# Patient Record
Sex: Female | Born: 1935 | Race: White | Hispanic: No | Smoking: Never smoker
Health system: Southern US, Community
[De-identification: ages and names within clinical notes are randomized; demographics above are authoritative.]

## PROBLEM LIST (undated history)

## (undated) DIAGNOSIS — J984 Other disorders of lung: Secondary | ICD-10-CM

## (undated) DIAGNOSIS — W19XXXA Unspecified fall, initial encounter: Secondary | ICD-10-CM

## (undated) DIAGNOSIS — J449 Chronic obstructive pulmonary disease, unspecified: Secondary | ICD-10-CM

## (undated) DIAGNOSIS — I10 Essential (primary) hypertension: Secondary | ICD-10-CM

## (undated) DIAGNOSIS — E039 Hypothyroidism, unspecified: Secondary | ICD-10-CM

## (undated) DIAGNOSIS — R296 Repeated falls: Secondary | ICD-10-CM

---

## 2009-09-04 HISTORY — PX: SUBDURAL HEMORRHAGE DRAINAGE: SHX845

## 2010-06-12 ENCOUNTER — Ambulatory Visit: Payer: Self-pay | Admitting: Internal Medicine

## 2010-06-12 ENCOUNTER — Inpatient Hospital Stay (HOSPITAL_COMMUNITY): Admission: EM | Admit: 2010-06-12 | Discharge: 2010-06-15 | Payer: Self-pay | Admitting: Neurological Surgery

## 2010-06-25 ENCOUNTER — Inpatient Hospital Stay (HOSPITAL_COMMUNITY): Admission: EM | Admit: 2010-06-25 | Discharge: 2010-06-30 | Payer: Self-pay | Admitting: Emergency Medicine

## 2010-07-07 ENCOUNTER — Encounter: Admission: RE | Admit: 2010-07-07 | Discharge: 2010-07-07 | Payer: Self-pay | Admitting: Neurosurgery

## 2010-11-16 LAB — BASIC METABOLIC PANEL
BUN: 17 mg/dL (ref 6–23)
CO2: 27 mEq/L (ref 19–32)
Calcium: 8.6 mg/dL (ref 8.4–10.5)
Calcium: 8.6 mg/dL (ref 8.4–10.5)
Chloride: 108 mEq/L (ref 96–112)
Creatinine, Ser: 0.74 mg/dL (ref 0.4–1.2)
GFR calc Af Amer: >60 mL/min (ref >60)
GFR calc non Af Amer: >60 mL/min (ref >60)
GFR calc non Af Amer: >60 mL/min (ref >60)
Glucose, Bld: 140 mg/dL — ABNORMAL HIGH (ref 70–99)
Potassium: 4 mEq/L (ref 3.5–5.1)
Sodium: 135 mEq/L (ref 135–145)
Sodium: 138 mEq/L (ref 135–145)

## 2010-11-16 LAB — MRSA PCR SCREENING: MRSA by PCR: NEGATIVE

## 2010-11-16 LAB — COMPREHENSIVE METABOLIC PANEL
ALT: 17 U/L (ref 0–35)
AST: 23 U/L (ref 0–37)
Alkaline Phosphatase: 68 U/L (ref 39–117)
CO2: 27 mEq/L (ref 19–32)
Calcium: 8.5 mg/dL (ref 8.4–10.5)
GFR calc Af Amer: >60 mL/min (ref >60)
GFR calc non Af Amer: >60 mL/min (ref >60)
Potassium: 3.8 mEq/L (ref 3.5–5.1)
Sodium: 136 mEq/L (ref 135–145)
Total Protein: 6.2 g/dL (ref 6.0–8.3)

## 2010-11-16 LAB — URINE MICROSCOPIC-ADD ON

## 2010-11-16 LAB — DIFFERENTIAL
Eosinophils Absolute: 0.1 10*3/uL (ref 0.0–0.7)
Eosinophils Relative: 2 % (ref 0–5)
Lymphocytes Relative: 5 % — ABNORMAL LOW (ref 12–46)
Lymphs Abs: 0.7 10*3/uL (ref 0.7–4.0)
Lymphs Abs: 1.5 10*3/uL (ref 0.7–4.0)
Monocytes Absolute: 0.4 10*3/uL (ref 0.1–1.0)
Monocytes Relative: 3 % (ref 3–12)
Monocytes Relative: 8 % (ref 3–12)
Neutro Abs: 12.1 10*3/uL — ABNORMAL HIGH (ref 1.7–7.7)
Neutrophils Relative %: 92 % — ABNORMAL HIGH (ref 43–77)

## 2010-11-16 LAB — URINE CULTURE
Colony Count: NO GROWTH
Culture: NO GROWTH

## 2010-11-16 LAB — CBC
HCT: 31.8 % — ABNORMAL LOW (ref 36.0–46.0)
Hemoglobin: 10.9 g/dL — ABNORMAL LOW (ref 12.0–15.0)
Hemoglobin: 11.2 g/dL — ABNORMAL LOW (ref 12.0–15.0)
Hemoglobin: 11.7 g/dL — ABNORMAL LOW (ref 12.0–15.0)
MCH: 32.1 pg (ref 26.0–34.0)
MCHC: 33.3 g/dL (ref 30.0–36.0)
MCHC: 34.3 g/dL (ref 30.0–36.0)
RBC: 3.46 MIL/uL — ABNORMAL LOW (ref 3.87–5.11)
WBC: 6.2 10*3/uL (ref 4.0–10.5)

## 2010-11-16 LAB — URINALYSIS, ROUTINE W REFLEX MICROSCOPIC
Bilirubin Urine: NEGATIVE
Glucose, UA: NEGATIVE mg/dL
Ketones, ur: NEGATIVE mg/dL
Protein, ur: NEGATIVE mg/dL
Specific Gravity, Urine: 1.025 (ref 1.005–1.030)
Urobilinogen, UA: 1 mg/dL (ref 0.0–1.0)

## 2010-11-16 LAB — CULTURE, BLOOD (ROUTINE X 2)
Culture  Setup Time: 201110251430
Culture: NO GROWTH

## 2010-11-16 LAB — GLUCOSE, CAPILLARY: Glucose-Capillary: 99 mg/dL (ref 70–99)

## 2010-11-17 LAB — BASIC METABOLIC PANEL
CO2: 27 mEq/L (ref 19–32)
Calcium: 9.3 mg/dL (ref 8.4–10.5)
Chloride: 108 mEq/L (ref 96–112)
GFR calc Af Amer: >60 mL/min (ref >60)
Potassium: 4.6 mEq/L (ref 3.5–5.1)
Sodium: 139 mEq/L (ref 135–145)

## 2010-11-17 LAB — CBC
MCV: 94.7 fL (ref 78.0–100.0)
Platelets: 245 10*3/uL (ref 150–400)
RDW: 12.5 % (ref 11.5–15.5)
WBC: 5.9 10*3/uL (ref 4.0–10.5)

## 2010-11-17 LAB — CARDIAC PANEL(CRET KIN+CKTOT+MB+TROPI)
CK, MB: 1 ng/mL (ref 0.3–4.0)
CK, MB: 1.7 ng/mL (ref 0.3–4.0)
Relative Index: INVALID (ref 0.0–2.5)
Relative Index: INVALID (ref 0.0–2.5)
Relative Index: INVALID (ref 0.0–2.5)
Total CK: 71 U/L (ref 7–177)
Troponin I: 0.01 ng/mL (ref 0.00–0.06)
Troponin I: 0.02 ng/mL (ref 0.00–0.06)

## 2010-11-17 LAB — HEPATIC FUNCTION PANEL
ALT: 18 U/L (ref 0–35)
Total Protein: 6.7 g/dL (ref 6.0–8.3)

## 2010-11-17 LAB — DIFFERENTIAL
Basophils Absolute: 0 10*3/uL (ref 0.0–0.1)
Eosinophils Absolute: 0.1 10*3/uL (ref 0.0–0.7)
Eosinophils Relative: 2 % (ref 0–5)
Lymphocytes Relative: 18 % (ref 12–46)
Neutrophils Relative %: 70 % (ref 43–77)

## 2010-11-17 LAB — PHOSPHORUS: Phosphorus: 2.5 mg/dL (ref 2.3–4.6)

## 2013-02-15 DIAGNOSIS — M6281 Muscle weakness (generalized): Secondary | ICD-10-CM

## 2014-02-27 ENCOUNTER — Inpatient Hospital Stay (HOSPITAL_COMMUNITY): Admit: 2014-02-27 | Payer: Self-pay | Source: Other Acute Inpatient Hospital | Admitting: Internal Medicine

## 2014-02-28 ENCOUNTER — Inpatient Hospital Stay (HOSPITAL_COMMUNITY): Payer: Medicare Other

## 2014-02-28 ENCOUNTER — Encounter (HOSPITAL_COMMUNITY): Payer: Self-pay | Admitting: Internal Medicine

## 2014-02-28 ENCOUNTER — Inpatient Hospital Stay (HOSPITAL_COMMUNITY)
Admission: AD | Admit: 2014-02-28 | Discharge: 2014-03-03 | DRG: 551 | Disposition: A | Discharge status: Skilled Nursing Facility | Payer: Medicare Other | Source: Other Acute Inpatient Hospital | Attending: Internal Medicine | Admitting: Internal Medicine

## 2014-02-28 DIAGNOSIS — J449 Chronic obstructive pulmonary disease, unspecified: Secondary | ICD-10-CM | POA: Diagnosis present

## 2014-02-28 DIAGNOSIS — S22009A Unspecified fracture of unspecified thoracic vertebra, initial encounter for closed fracture: Principal | ICD-10-CM | POA: Diagnosis present

## 2014-02-28 DIAGNOSIS — J42 Unspecified chronic bronchitis: Secondary | ICD-10-CM

## 2014-02-28 DIAGNOSIS — Z8673 Personal history of transient ischemic attack (TIA), and cerebral infarction without residual deficits: Secondary | ICD-10-CM

## 2014-02-28 DIAGNOSIS — G9519 Other vascular myelopathies: Secondary | ICD-10-CM | POA: Diagnosis present

## 2014-02-28 DIAGNOSIS — M549 Dorsalgia, unspecified: Secondary | ICD-10-CM | POA: Diagnosis present

## 2014-02-28 DIAGNOSIS — J4489 Other specified chronic obstructive pulmonary disease: Secondary | ICD-10-CM | POA: Diagnosis present

## 2014-02-28 DIAGNOSIS — W19XXXA Unspecified fall, initial encounter: Secondary | ICD-10-CM | POA: Diagnosis present

## 2014-02-28 DIAGNOSIS — IMO0001 Reserved for inherently not codable concepts without codable children: Secondary | ICD-10-CM

## 2014-02-28 DIAGNOSIS — Z833 Family history of diabetes mellitus: Secondary | ICD-10-CM

## 2014-02-28 DIAGNOSIS — Z7982 Long term (current) use of aspirin: Secondary | ICD-10-CM

## 2014-02-28 DIAGNOSIS — I10 Essential (primary) hypertension: Secondary | ICD-10-CM | POA: Diagnosis present

## 2014-02-28 DIAGNOSIS — Z8249 Family history of ischemic heart disease and other diseases of the circulatory system: Secondary | ICD-10-CM

## 2014-02-28 DIAGNOSIS — J984 Other disorders of lung: Secondary | ICD-10-CM | POA: Diagnosis present

## 2014-02-28 DIAGNOSIS — M546 Pain in thoracic spine: Secondary | ICD-10-CM

## 2014-02-28 DIAGNOSIS — E034 Atrophy of thyroid (acquired): Secondary | ICD-10-CM

## 2014-02-28 DIAGNOSIS — Z9181 History of falling: Secondary | ICD-10-CM

## 2014-02-28 DIAGNOSIS — E039 Hypothyroidism, unspecified: Secondary | ICD-10-CM | POA: Diagnosis present

## 2014-02-28 DIAGNOSIS — M4850XA Collapsed vertebra, not elsewhere classified, site unspecified, initial encounter for fracture: Secondary | ICD-10-CM

## 2014-02-28 DIAGNOSIS — IMO0002 Reserved for concepts with insufficient information to code with codable children: Secondary | ICD-10-CM

## 2014-02-28 DIAGNOSIS — R296 Repeated falls: Secondary | ICD-10-CM

## 2014-02-28 HISTORY — DX: Other disorders of lung: J98.4

## 2014-02-28 HISTORY — DX: Unspecified fall, initial encounter: W19.XXXA

## 2014-02-28 HISTORY — DX: Hypothyroidism, unspecified: E03.9

## 2014-02-28 HISTORY — DX: Repeated falls: R29.6

## 2014-02-28 HISTORY — DX: Chronic obstructive pulmonary disease, unspecified: J44.9

## 2014-02-28 HISTORY — DX: Essential (primary) hypertension: I10

## 2014-02-28 LAB — COMPREHENSIVE METABOLIC PANEL
ALBUMIN: 3.1 g/dL — AB (ref 3.5–5.2)
ALT: 23 U/L (ref 0–35)
AST: 33 U/L (ref 0–37)
Alkaline Phosphatase: 113 U/L (ref 39–117)
BILIRUBIN TOTAL: 0.5 mg/dL (ref 0.3–1.2)
BUN: 19 mg/dL (ref 6–23)
CALCIUM: 9.1 mg/dL (ref 8.4–10.5)
CHLORIDE: 101 meq/L (ref 96–112)
CO2: 23 mEq/L (ref 19–32)
CREATININE: 0.79 mg/dL (ref 0.50–1.10)
GFR calc Af Amer: 82 mL/min — ABNORMAL LOW (ref >90)
GFR calc non Af Amer: 71 mL/min — ABNORMAL LOW (ref >90)
Glucose, Bld: 86 mg/dL (ref 70–99)
Potassium: 4.1 mEq/L (ref 3.7–5.3)
Sodium: 139 mEq/L (ref 137–147)
TOTAL PROTEIN: 7.1 g/dL (ref 6.0–8.3)

## 2014-02-28 LAB — PROTIME-INR
INR: 1.02 (ref 0.00–1.49)
Prothrombin Time: 13.4 seconds (ref 11.6–15.2)

## 2014-02-28 LAB — CBC WITH DIFFERENTIAL/PLATELET
Basophils Absolute: 0 10*3/uL (ref 0.0–0.1)
Basophils Relative: 0 % (ref 0–1)
EOS ABS: 0.1 10*3/uL (ref 0.0–0.7)
EOS PCT: 2 % (ref 0–5)
HCT: 33.4 % — ABNORMAL LOW (ref 36.0–46.0)
HEMOGLOBIN: 11.2 g/dL — AB (ref 12.0–15.0)
LYMPHS ABS: 1.7 10*3/uL (ref 0.7–4.0)
Lymphocytes Relative: 23 % (ref 12–46)
MCH: 32.6 pg (ref 26.0–34.0)
MCHC: 33.5 g/dL (ref 30.0–36.0)
MCV: 97.1 fL (ref 78.0–100.0)
MONOS PCT: 7 % (ref 3–12)
Monocytes Absolute: 0.5 10*3/uL (ref 0.1–1.0)
Neutro Abs: 5 10*3/uL (ref 1.7–7.7)
Neutrophils Relative %: 68 % (ref 43–77)
Platelets: 306 10*3/uL (ref 150–400)
RBC: 3.44 MIL/uL — AB (ref 3.87–5.11)
RDW: 13.1 % (ref 11.5–15.5)
WBC: 7.5 10*3/uL (ref 4.0–10.5)

## 2014-02-28 LAB — APTT: aPTT: 32 seconds (ref 24–37)

## 2014-02-28 MED ORDER — POLYETHYLENE GLYCOL 3350 17 G PO PACK
17.0000 g | PACK | Freq: Every day | ORAL | Status: DC
  Administered 2014-02-28 – 2014-03-01 (×2): 17 g via ORAL
  Filled 2014-02-28 (×2): qty 1

## 2014-02-28 MED ORDER — SENNA 8.6 MG PO TABS
1.0000 | ORAL_TABLET | Freq: Two times a day (BID) | ORAL | Status: DC
  Administered 2014-02-28 (×2): 8.6 mg via ORAL
  Filled 2014-02-28 (×2): qty 1

## 2014-02-28 MED ORDER — ENOXAPARIN SODIUM 40 MG/0.4ML ~~LOC~~ SOLN
40.0000 mg | Freq: Every day | SUBCUTANEOUS | Status: DC
  Administered 2014-02-28 – 2014-03-03 (×4): 40 mg via SUBCUTANEOUS
  Filled 2014-02-28 (×4): qty 0.4

## 2014-02-28 MED ORDER — CROMOLYN SODIUM 20 MG/2ML IN NEBU
20.0000 mg | INHALATION_SOLUTION | Freq: Two times a day (BID) | RESPIRATORY_TRACT | Status: DC
  Administered 2014-02-28 – 2014-03-02 (×5): 20 mg via RESPIRATORY_TRACT
  Filled 2014-02-28 (×16): qty 2

## 2014-02-28 MED ORDER — OXYCODONE HCL ER 10 MG PO T12A
20.0000 mg | EXTENDED_RELEASE_TABLET | Freq: Two times a day (BID) | ORAL | Status: DC
  Administered 2014-02-28 (×2): 20 mg via ORAL
  Filled 2014-02-28 (×2): qty 2

## 2014-02-28 MED ORDER — ONDANSETRON HCL 4 MG PO TABS: 4.0000 mg | ORAL_TABLET | Freq: Four times a day (QID) | ORAL | Status: DC | PRN

## 2014-02-28 MED ORDER — OXYCODONE HCL 5 MG PO TABS
5.0000 mg | ORAL_TABLET | ORAL | Status: DC | PRN
  Administered 2014-02-28 (×3): 5 mg via ORAL
  Filled 2014-02-28 (×3): qty 1

## 2014-02-28 MED ORDER — MORPHINE SULFATE 2 MG/ML IJ SOLN
1.0000 mg | INTRAMUSCULAR | Status: DC | PRN
  Administered 2014-02-28: 2 mg via INTRAVENOUS
  Administered 2014-02-28: 1 mg via INTRAVENOUS
  Filled 2014-02-28 (×2): qty 1

## 2014-02-28 MED ORDER — HYDRALAZINE HCL 20 MG/ML IJ SOLN
10.0000 mg | INTRAMUSCULAR | Status: DC | PRN
  Filled 2014-02-28: qty 1

## 2014-02-28 MED ORDER — ACETAMINOPHEN 325 MG PO TABS: 650.0000 mg | ORAL_TABLET | Freq: Four times a day (QID) | ORAL | Status: DC | PRN

## 2014-02-28 MED ORDER — ALBUTEROL SULFATE (2.5 MG/3ML) 0.083% IN NEBU: 2.5000 mg | INHALATION_SOLUTION | RESPIRATORY_TRACT | Status: DC | PRN

## 2014-02-28 MED ORDER — SODIUM CHLORIDE 0.9 % IJ SOLN
3.0000 mL | Freq: Two times a day (BID) | INTRAMUSCULAR | Status: DC
  Administered 2014-02-28 – 2014-03-03 (×5): 3 mL via INTRAVENOUS

## 2014-02-28 MED ORDER — LISINOPRIL 20 MG PO TABS
20.0000 mg | ORAL_TABLET | Freq: Every day | ORAL | Status: DC
  Administered 2014-02-28: 20 mg via ORAL
  Filled 2014-02-28: qty 1

## 2014-02-28 MED ORDER — FENTANYL 25 MCG/HR TD PT72: 25.0000 ug | MEDICATED_PATCH | TRANSDERMAL | Status: DC

## 2014-02-28 MED ORDER — ONDANSETRON HCL 4 MG/2ML IJ SOLN: 4.0000 mg | Freq: Four times a day (QID) | INTRAMUSCULAR | Status: DC | PRN

## 2014-02-28 MED ORDER — HYDROCODONE-ACETAMINOPHEN 5-325 MG PO TABS: 1.0000 | ORAL_TABLET | Freq: Four times a day (QID) | ORAL | Status: DC | PRN

## 2014-02-28 MED ORDER — ACETAMINOPHEN 650 MG RE SUPP: 650.0000 mg | Freq: Four times a day (QID) | RECTAL | Status: DC | PRN

## 2014-02-28 MED ORDER — LEVOTHYROXINE SODIUM 25 MCG PO TABS
50.0000 ug | ORAL_TABLET | Freq: Every day | ORAL | Status: DC
  Administered 2014-02-28 – 2014-03-03 (×4): 50 ug via ORAL
  Filled 2014-02-28 (×4): qty 2

## 2014-02-28 NOTE — Consult Note (Signed)
Reason for Consult: T12 compression fracture, thoracic pain Referring Physician: Dr. Frederich ChaPatel  Nicole Stein is an 80 y.o. female.  HPI: The patient is a 80 year old white female who by report fell approximately 2 weeks ago. She was seen at the [facility] and diagnosed with a T12 compression fracture. At that time the patient was treated and released. She returned to the hospital yesterday complaining of continued/increasing back pain. A thoracic MRI was obtained which demonstrated a T12 compression fracture with spinal stenosis. The ER doctor said that the spine surgeon in [place] was out of town so they request a transfer to [facility]. The patient was admitted by Dr. Allena KatzPatel.  Presently the patient is alert and pleasant. She complains of pain at approximately T12. She has some pain radiating to her right leg as far as her knee. She has no numbness, tingling, or weakness. She has not been treated in an orthosis.  Past Medical History  Diagnosis Date  . Hypothyroidism   . Chronic lung disease   . COPD (chronic obstructive pulmonary disease)   . Falls   . Hypertension     Past Surgical History  Procedure Laterality Date  . Subdural hemorrhage drainage  2011    Family History  Problem Relation Age of Onset  . CAD    . Hypertension    . Diabetes      Social History:  reports that she has never smoked. She does not have any smokeless tobacco history on file. She reports that she does not drink alcohol. Her drug history is not on file.  Allergies: No Known Allergies  Medications:  I have reviewed the patient's current medications. Prior to Admission:  Prescriptions prior to admission  Medication Sig Dispense Refill  . albuterol (PROVENTIL) (2.5 MG/3ML) 0.083% nebulizer solution Take 2.5 mg by nebulization every 4 (four) hours as needed for wheezing or shortness of breath.      [name]. aspirin 81 MG chewable tablet Chew 81 mg by mouth daily.      . cromolyn  (INTAL) 20 MG/2ML nebulizer solution Take 20 mg by nebulization 2 (two) times daily.      [name]. HYDROcodone-acetaminophen (NORCO/VICODIN) 5-325 MG per tablet Take 1 tablet by mouth every 6 (six) hours as needed for moderate pain.      [name]. levothyroxine (SYNTHROID, LEVOTHROID) 50 MCG tablet Take 50 mcg by mouth daily before breakfast.      . lisinopril (PRINIVIL,ZESTRIL) 20 MG tablet Take 20 mg by mouth daily.       Scheduled: . cromolyn  20 mg Nebulization BID  . levothyroxine  50 mcg Oral QAC breakfast  . lisinopril  20 mg Oral Daily  . senna  1 tablet Oral BID  . sodium chloride  3 mL Intravenous Q12H   Continuous:  ZOX:[id number]:acetaminophen, acetaminophen, albuterol, HYDROcodone-acetaminophen, morphine injection, ondansetron (ZOFRAN) IV, ondansetron, oxyCODONE Anti-infectives   None       No results found for this or any previous visit (from the past 48 hour(s)).  No results found.  ROS as above, the patient denies neck pain Blood pressure 216/77, pulse 81, temperature 97.9 F (36.6 C), temperature source Oral, resp. rate 16, height 5\' 4"  (1.626 m), weight 60.601 kg (133 lb 9.6 oz), SpO2 96.00%. Physical Exam  General: An alert and pleasant 80 year old white female in no apparent distress  HEENT: Normocephalic, extraocular muscles intact,  Neck: Supple without masses or deformities. She is an age-appropriate normal range of motion.  Thorax: Symmetric  Abdomen: Soft  Extremities: No obvious abnormalities  Neurologic exam: The patient is alert and oriented x3, Glasgow Coma Scale 15. Cranial nerves II through XII were examined bilaterally and grossly normal. Vision and hearing are grossly normal bilaterally. Sensory function is intact to light touch sensation all tested dermatomes bilaterally. Cerebellar function is normal to rapid alternating movements of the upper extremities bilaterally. Her strength is normal in her bilateral biceps, triceps, hand grip, quadriceps, gastrocnemius,  dorsiflexors.  I have reviewed the patient's thoracic [facility]. It demonstrates the patient is a T12 compression fracture with retropulsion of the fracture into the spinal canal. She has moderate spinal stenosis. There is no evidence of spinal cord signal change.  Assessment/Plan: T12 compression fracture, thoracic pain: I have discussed situation with the patient. We have discussed the various treatment options including conservative treatment with an orthosis versus surgery. At her age I think that surgery is best avoided. I will have her fitted for a TLSO. When she has the orthosis we can begin to mobilize her. I have answered all her questions.  JENKINS,JEFFREY D 02/28/2014, 6:51 AM

## 2014-02-28 NOTE — Progress Notes (Signed)
PATIENT DETAILS Name: Nicole Stein Age: 80 y.o. Sex: female Date of Birth: Jul 03, 1922 Admit Date: 02/28/2014 Admitting Physician Lynden OxfordPranav Patel, MD PCP:No PCP Per Patient  Subjective: Still complains of back pain.  Assessment/Plan: Principal Problem:   Vertebral compression fracture-T12 - Secondary to mechanical fall, unfortunately patient sustained a T12 compression fracture with retropulsion of fracture fragments into the spinal canal causing spinal stenosis. Seen by neurosurgery, options including surgery and conservative teams discussed with family, current recommendations are to avoid surgery if at all possible. Await TLSO brace, then mobilize with physical therapy. Start long-acting OxyContin 20 mg twice a day, continue using oxycodone for moderate breakthrough pain, IV morphine for severe breakthrough pain. Continue Senokot for bowel regimen. Follow clinical course.  Active Problems: COPD - Lungs clear, continue with as needed albuterol nebulizer.    Hypothyroidism - Continue levothyroxine  Hypertension - Controlled, continue with lisinopril   Disposition: Remain inpatient  DVT Prophylaxis: Prophylactic Lovenox  Code Status: Full code  Family Communication None at bedside  Procedures:  None  CONSULTS:  Neurosurgery  Time spent 40 minutes-which includes 50% of the time with face-to-face with patient/ family and coordinating care related to the above assessment and plan.    MEDICATIONS: Scheduled Meds: . cromolyn  20 mg Nebulization BID  . levothyroxine  50 mcg Oral QAC breakfast  . lisinopril  20 mg Oral Daily  . OxyCODONE  20 mg Oral Q12H  . senna  1 tablet Oral BID  . sodium chloride  3 mL Intravenous Q12H   Continuous Infusions:  PRN Meds:.acetaminophen, acetaminophen, albuterol, hydrALAZINE, morphine injection, ondansetron (ZOFRAN) IV, ondansetron, oxyCODONE  Antibiotics: Anti-infectives   None       PHYSICAL EXAM: Vital signs  in last 24 hours: Filed Vitals:   02/28/14 0030 02/28/14 0549 02/28/14 1033  BP: 216/77 110/84 170/81  Pulse: 81 77 75  Temp: 97.9 F (36.6 C) 98.3 F (36.8 C) 97.8 F (36.6 C)  TempSrc: Oral Oral Axillary  Resp: 16 18 18   Height: 5\' 4"  (1.626 m)    Weight: 60.601 kg (133 lb 9.6 oz)    SpO2: 96% 92% 95%    Weight change:  Filed Weights   02/28/14 0030  Weight: 60.601 kg (133 lb 9.6 oz)   Body mass index is 22.92 kg/(m^2).   Gen Exam: Awake and alert with clear speech.   Neck: Supple, No JVD.   Chest: B/L Clear.   CVS: S1 S2 Regular, no murmurs.  Abdomen: soft, BS +, non tender, non distended.  Extremities: no edema, lower extremities warm to touch. Neurologic: Non Focal.   Skin: No Rash.   Wounds: N/A.    Intake/Output from previous day: No intake or output data in the 24 hours ending 02/28/14 1308   LAB RESULTS: CBC  Recent Labs Lab 02/28/14 0724  WBC 7.5  HGB 11.2*  HCT 33.4*  PLT 306  MCV 97.1  MCH 32.6  MCHC 33.5  RDW 13.1  LYMPHSABS 1.7  MONOABS 0.5  EOSABS 0.1  BASOSABS 0.0    Chemistries   Recent Labs Lab 02/28/14 0724  NA 139  K 4.1  CL 101  CO2 23  GLUCOSE 86  BUN 19  CREATININE 0.79  CALCIUM 9.1    CBG: No results found for this basename: GLUCAP,  in the last 168 hours  GFR Estimated Creatinine Clearance: 40.4 ml/min (by C-G formula based on Cr of 0.79).  Coagulation profile  Recent Labs  Lab 02/28/14 0724  INR 1.02    Cardiac Enzymes No results found for this basename: CK, CKMB, TROPONINI, MYOGLOBIN,  in the last 168 hours  No components found with this basename: POCBNP,  No results found for this basename: DDIMER,  in the last 72 hours No results found for this basename: HGBA1C,  in the last 72 hours No results found for this basename: CHOL, HDL, LDLCALC, TRIG, CHOLHDL, LDLDIRECT,  in the last 72 hours No results found for this basename: TSH, T4TOTAL, FREET3, T3FREE, THYROIDAB,  in the last 72 hours No results  found for this basename: VITAMINB12, FOLATE, FERRITIN, TIBC, IRON, RETICCTPCT,  in the last 72 hours No results found for this basename: LIPASE, AMYLASE,  in the last 72 hours  Urine Studies No results found for this basename: UACOL, UAPR, USPG, UPH, UTP, UGL, UKET, UBIL, UHGB, UNIT, UROB, ULEU, UEPI, UWBC, URBC, UBAC, CAST, CRYS, UCOM, BILUA,  in the last 72 hours  MICROBIOLOGY: No results found for this or any previous visit (from the past 240 hour(s)).  RADIOLOGY STUDIES/RESULTS: Dg Chest Port 1 View  02/28/2014   CLINICAL DATA:  Crackles on the left lung.  Shortness of breath.  EXAM: PORTABLE CHEST - 1 VIEW  COMPARISON:  Chest x-ray 02/15/2013.  FINDINGS: Diffuse interstitial prominence and peribronchial cuffing, similar prior studies. No acute consolidative airspace disease. No pleural effusions. No evidence of pulmonary edema. Heart size is normal. Upper mediastinal contours are within normal limits. Atherosclerosis in the thoracic aorta. Multiple old healed left-sided rib fractures are incidentally noted.  IMPRESSION: 1. Diffuse interstitial prominence and peribronchial cuffing, similar to prior studies, favored to reflect chronic bronchitis. 2. No definite acute findings are noted. 3. Atherosclerosis.   Electronically Signed   By: Trudie Reedaniel  Entrikin M.D.   On: 02/28/2014 10:55    Jeoffrey MassedGHIMIRE,SHANKER, MD  [facility] Pager:[phone]  If 7PM-7AM, please contact night-coverage [email or web address] Password [email or web address] 02/28/2014, 1:08 PM   LOS: 0 days   **Disclaimer: This note may have been dictated with voice recognition software. Similar sounding words can inadvertently be transcribed and this note may contain transcription errors which may not have been corrected upon publication of note.**

## 2014-02-28 NOTE — Progress Notes (Signed)
Pt arrived via [facility] at [phone]. Pt alert and oriented, call bell in place, bed alarm on. Pt continent and not in distress. Will continue to monitor.

## 2014-02-28 NOTE — H&P (Signed)
[facility] History and Physical  Patient: Nicole PintoMary W Krinsky  ZOX:[id number]  DOB: March 02, 1922  DOS: the patient was seen and examined 02/28/2014 PCP: No PCP Per Patient  Chief Complaint: Abnormal imaging, back pain  HPI: Nicole Stein is a 80 y.o. female with Past medical history of hypothyroidism, chronic lung disease of unknown etiology, questionable COPD, hypertension, recurrent fall. Patient presented with complaints of back pain. She mentions 2 weeks ago she was standing from her recliner and she twisted her right ankle and fell on the ground hitting her back. She started having severe back pain and therefore she went to the ED at that time [facility]. There she had imaging of her back and spine there was no imaging done of her head as she did not have any complaint of headache or head injury. Due to the presence of compression fracture that they found on the spinal accident in place her on Lortab and sent her home. Since last 2 weeks she has been having excruciating back pain and continues to have difficulty ambulation due to the same. And it has been not getting better. Therefore she went to see her PCP who ordered an MRI and the MRI to be was resulted as a showing severe compression fracture with possible spinal canal stenosis with possible spinal cord edema. Neurosurgery was initially consulted and patient was recommended to be admitted by the hospitalist service. At the time of my evaluation the patient denies any complaint of focal neurological deficit, no fever no chills no incontinence of bowel or bladder no burning urination. No diarrhea no constipation no nausea no vomiting no abdominal pain. No redness. She mentions she has been having recurrent falls in the last few years but does not have any joint replacement done. At her baseline she walks with a walker.  The patient is coming from home. And at her baseline independent for most of her ADL.  Review of Systems: as  mentioned in the history of present illness.  A Comprehensive review of the other systems is negative.  Past Medical History  Diagnosis Date  . Hypothyroidism   . Chronic lung disease   . COPD (chronic obstructive pulmonary disease)   . Falls   . Hypertension    Past Surgical History  Procedure Laterality Date  . Subdural hemorrhage drainage  2011   Social History:  reports that she has never smoked. She does not have any smokeless tobacco history on file. She reports that she does not drink alcohol. Her drug history is not on file.  No Known Allergies  Family History  Problem Relation Age of Onset  . CAD    . Hypertension    . Diabetes      Prior to Admission medications   Medication Sig Start Date End Date Taking? Authorizing Provider  albuterol (PROVENTIL) (2.5 MG/3ML) 0.083% nebulizer solution Take 2.5 mg by nebulization every 4 (four) hours as needed for wheezing or shortness of breath.   Yes Historical Provider, MD  aspirin 81 MG chewable tablet Chew 81 mg by mouth daily.   Yes Historical Provider, MD  cromolyn (INTAL) 20 MG/2ML nebulizer solution Take 20 mg by nebulization 2 (two) times daily.   Yes Historical Provider, MD  HYDROcodone-acetaminophen (NORCO/VICODIN) 5-325 MG per tablet Take 1 tablet by mouth every 6 (six) hours as needed for moderate pain.   Yes Historical Provider, MD  levothyroxine (SYNTHROID, LEVOTHROID) 50 MCG tablet Take 50 mcg by mouth daily before breakfast.   Yes  Historical Provider, MD  lisinopril (PRINIVIL,ZESTRIL) 20 MG tablet Take 20 mg by mouth daily.   Yes Historical Provider, MD    Physical Exam: Filed Vitals:   02/28/14 0030  BP: 216/77  Pulse: 81  Temp: 97.9 F (36.6 C)  TempSrc: Oral  Resp: 16  Height: 5\' 4"  (1.626 m)  Weight: 60.601 kg (133 lb 9.6 oz)  SpO2: 96%    General: Alert, Awake and Oriented to Time, Place and Person. Appear in mild distress Eyes: PERRL ENT: Oral Mucosa clear moist. Neck: no JVD Cardiovascular: S1  and S2 Present, no Murmur, Peripheral Pulses Present Respiratory: Bilateral Air entry equal and Decreased, left-sided Crackles, no wheezes Abdomen: Bowel Sound Present, Soft and Non tender Skin: No Rash Extremities: No Pedal edema, no calf tenderness Back pain radiating to right back of the thigh Neurologic: Grossly no focal neuro deficit.  Labs on Admission:  CBC: WBC 9.85, hemoglobin 11.6, platelet 339,   CMP  BUN 23, creatinine 0.8, sodium 136, potassium 3.9, chloride 102, CO2 26 and, glucose 97, calcium 9.4  Radiological Exams on Admission: MRI thoracic spine without contrast from outside facility Impression 1. Severe compression fracture of T12 to approximately 10% of the initial height with retropulsion of upper endplate into neural canal and grade 1 subluxation causing this significant spinal stenosis. There is a edema present which extend into pedicles indicating relatively acute etiology and raising suspicion of instability and risk for further subluxation with intermittent cord compression. Neurosurgical consultation is recommended. 2. Mild compression of lower thoracic spinal cord by the fracture but no definitive evidence of cord contusion.  Assessment/Plan Principal Problem:   Vertebral compression fracture-T12 Active Problems:   Hypothyroidism   Falls   Essential hypertension, benign   COPD (chronic obstructive pulmonary disease)   Chronic lung disease   Back pain   1. Vertebral compression fracture Patient presented with complaints of back pain. She was found to have spinal cord edema. [facility] there was no spinal surgeon of the liver and therefore the patient was transferred here. Due to lack of any neurological deficit the patient was recommended to be managed conservatively as per my discussion with the neurosurgery. At present patient continues to remain asymptomatic and not her only complaint is back pain. Therefore I would give her IV  morphine oral oxycodone and continue with the Norco for pain management. Once the neurosurgery clears her for PTOT we will continue the PTOT and brace. Surgical management indication would be decided by the neurosurgery.  2. COPD Chronic lung disease Patient appears to have some left-sided crackles and had some dry cough. I will obtain chest x-ray.  3. Hypothyroidism Continue Synthroid  4. Hypertension Continue with her home antihypertensive medications.  Consults: Neurosurgery, appreciate input  DVT Prophylaxis: Mechanical  compression device Nutrition:  n.p.o. except medication  Code Status:  Full, she mentions she does not want to prolong her life on life support. Disposition: Admitted to inpatient in telemetry unit.  Author: Lynden OxfordPranav Patel, MD [facility] Pager: [phone] 02/28/2014, 3:43 AM    If 7PM-7AM, please contact night-coverage [email or web address] Password [email or web address]  **Disclaimer: This note may have been dictated with voice recognition software. Similar sounding words can inadvertently be transcribed and this note may contain transcription errors which may not have been corrected upon publication of note.**

## 2014-03-01 MED ORDER — OXYCODONE HCL ER 15 MG PO T12A
30.0000 mg | EXTENDED_RELEASE_TABLET | Freq: Two times a day (BID) | ORAL | Status: DC
  Administered 2014-03-01 (×2): 30 mg via ORAL
  Filled 2014-03-01 (×2): qty 2

## 2014-03-01 MED ORDER — OXYCODONE HCL 5 MG PO TABS
5.0000 mg | ORAL_TABLET | ORAL | Status: DC | PRN
  Administered 2014-03-01 (×2): 10 mg via ORAL
  Administered 2014-03-01 – 2014-03-02 (×2): 5 mg via ORAL
  Administered 2014-03-02: 10 mg via ORAL
  Administered 2014-03-03: 5 mg via ORAL
  Administered 2014-03-03: 10 mg via ORAL
  Administered 2014-03-03: 5 mg via ORAL
  Filled 2014-03-01: qty 1
  Filled 2014-03-01: qty 2
  Filled 2014-03-01: qty 1
  Filled 2014-03-01: qty 2
  Filled 2014-03-01 (×2): qty 1
  Filled 2014-03-01 (×2): qty 2

## 2014-03-01 MED ORDER — FLEET ENEMA 7-19 GM/118ML RE ENEM: 1.0000 | ENEMA | Freq: Every day | RECTAL | Status: DC | PRN

## 2014-03-01 MED ORDER — LISINOPRIL 20 MG PO TABS
40.0000 mg | ORAL_TABLET | Freq: Every day | ORAL | Status: DC
  Administered 2014-03-01 – 2014-03-03 (×3): 40 mg via ORAL
  Filled 2014-03-01 (×3): qty 2

## 2014-03-01 MED ORDER — SENNA 8.6 MG PO TABS
2.0000 | ORAL_TABLET | Freq: Every day | ORAL | Status: DC
  Administered 2014-03-01 – 2014-03-02 (×2): 17.2 mg via ORAL
  Filled 2014-03-01 (×2): qty 2

## 2014-03-01 NOTE — Progress Notes (Signed)
Clinical Social Work Department BRIEF PSYCHOSOCIAL ASSESSMENT 03/01/2014  Patient:  ELLENORE, ROSCOE     Account Number:  [id number]     Admit date:  02/28/2014  Clinical Social Worker:  Rolinda Roan  Date/Time:  03/01/2014 07:06 PM  Referred by:  Physician  Date Referred:  02/28/2014 Referred for  SNF Placement   Other Referral:   Interview type:  Patient Other interview type:    PSYCHOSOCIAL DATA Living Status:  ALONE Admitted from facility:   Level of care:   Primary support name:  Donnie Mesa Primary support relationship to patient:  CHILD, ADULT Degree of support available:   Good support per patient.    CURRENT CONCERNS  Other Concerns:    SOCIAL WORK ASSESSMENT / PLAN Clinical Social Worker (CSW) met with patient to discuss SNF placement. Patient reported that she lives in [place] alone and her son Coralyn Mark lives near by. Per patient Coralyn Mark checks in on a daily basis. Patient is agreeable to SNF search in [place] and prefers [facility]. Patient reported that she has been to [facility] 3-4 times.   Assessment/plan status:  Psychosocial Support/Ongoing Assessment of Needs Other assessment/ plan:   Information/referral to community resources:    PATIENT'S/FAMILY'S RESPONSE TO PLAN OF CARE: Patient thanked CSW for visit and assisting with placement process.

## 2014-03-01 NOTE — Progress Notes (Signed)
PATIENT DETAILS Name: Nicole Stein Age: 80 y.o. Sex: female Date of Birth: 27-Mar-1922 Admit Date: 02/28/2014 Admitting Physician Lynden OxfordPranav Patel, MD PCP:No PCP Per Patient  Subjective: Still complains of back pain.-worse with movement  Assessment/Plan: Principal Problem:   Vertebral compression fracture-T12 - Secondary to mechanical fall, unfortunately patient sustained a T12 compression fracture with retropulsion of fracture fragments into the spinal canal causing spinal stenosis. Seen by neurosurgery, options including surgery and conservative teams discussed with family, current recommendations are to avoid surgery if at all possible.  -Await TLSO brace, then mobilize with physical therapy. Increase long-acting OxyContin to 30 mg twice a day, continue using oxycodone for moderate breakthrough pain, IV morphine for severe breakthrough pain. Continue Senokot and Miralax for bowel regimen. Follow clinical course.  Active Problems: COPD - Lungs clear, continue with as needed albuterol nebulizer.    Hypothyroidism - Continue levothyroxine  Hypertension - moderately controlled, continue with lisinopril-but increase to 40 mg   Disposition: Remain inpatient-suspect will require SNF placement  DVT Prophylaxis: Prophylactic Lovenox  Code Status: Full code  Family Communication None at bedside  Procedures:  None  CONSULTS:  Neurosurgery   MEDICATIONS: Scheduled Meds: . cromolyn  20 mg Nebulization BID  . enoxaparin (LOVENOX) injection  40 mg Subcutaneous Daily  . levothyroxine  50 mcg Oral QAC breakfast  . lisinopril  20 mg Oral Daily  . OxyCODONE  30 mg Oral Q12H  . polyethylene glycol  17 g Oral Daily  . senna  2 tablet Oral QHS  . sodium chloride  3 mL Intravenous Q12H   Continuous Infusions:  PRN Meds:.acetaminophen, acetaminophen, albuterol, hydrALAZINE, morphine injection, ondansetron (ZOFRAN) IV, ondansetron, oxyCODONE, sodium  phosphate  Antibiotics: Anti-infectives   None       PHYSICAL EXAM: Vital signs in last 24 hours: Filed Vitals:   02/28/14 2045 02/28/14 2133 03/01/14 0107 03/01/14 0452  BP:  173/63 161/76 152/67  Pulse:  97 97 83  Temp:  98 F (36.7 C) 99.3 F (37.4 C) 98.7 F (37.1 C)  TempSrc:  Oral Oral Oral  Resp:  18 15 12   Height:      Weight:      SpO2: 93% 94% 97% 95%    Weight change:  Filed Weights   02/28/14 0030  Weight: 60.601 kg (133 lb 9.6 oz)   Body mass index is 22.92 kg/(m^2).   Gen Exam: Awake and alert with clear speech.   Neck: Supple, No JVD.   Chest: B/L Clear.   CVS: S1 S2 Regular, no murmurs.  Abdomen: soft, BS +, non tender, non distended.  Extremities: no edema, lower extremities warm to touch. Neurologic: Non Focal.   Skin: No Rash.   Wounds: N/A.    Intake/Output from previous day:  Intake/Output Summary (Last 24 hours) at 03/01/14 0838 Last data filed at 02/28/14 2012  Gross per 24 hour  Intake      0 ml  Output    300 ml  Net   -300 ml     LAB RESULTS: CBC  Recent Labs Lab 02/28/14 0724  WBC 7.5  HGB 11.2*  HCT 33.4*  PLT 306  MCV 97.1  MCH 32.6  MCHC 33.5  RDW 13.1  LYMPHSABS 1.7  MONOABS 0.5  EOSABS 0.1  BASOSABS 0.0    Chemistries   Recent Labs Lab 02/28/14 0724  NA 139  K 4.1  CL 101  CO2 23  GLUCOSE 86  BUN 19  CREATININE 0.79  CALCIUM 9.1    CBG: No results found for this basename: GLUCAP,  in the last 168 hours  GFR Estimated Creatinine Clearance: 40.4 ml/min (by C-G formula based on Cr of 0.79).  Coagulation profile  Recent Labs Lab 02/28/14 0724  INR 1.02    Cardiac Enzymes No results found for this basename: CK, CKMB, TROPONINI, MYOGLOBIN,  in the last 168 hours  No components found with this basename: POCBNP,  No results found for this basename: DDIMER,  in the last 72 hours No results found for this basename: HGBA1C,  in the last 72 hours No results found for this basename: CHOL,  HDL, LDLCALC, TRIG, CHOLHDL, LDLDIRECT,  in the last 72 hours No results found for this basename: TSH, T4TOTAL, FREET3, T3FREE, THYROIDAB,  in the last 72 hours No results found for this basename: VITAMINB12, FOLATE, FERRITIN, TIBC, IRON, RETICCTPCT,  in the last 72 hours No results found for this basename: LIPASE, AMYLASE,  in the last 72 hours  Urine Studies No results found for this basename: UACOL, UAPR, USPG, UPH, UTP, UGL, UKET, UBIL, UHGB, UNIT, UROB, ULEU, UEPI, UWBC, URBC, UBAC, CAST, CRYS, UCOM, BILUA,  in the last 72 hours  MICROBIOLOGY: No results found for this or any previous visit (from the past 240 hour(s)).  RADIOLOGY STUDIES/RESULTS: Dg Chest Port 1 View  02/28/2014   CLINICAL DATA:  Crackles on the left lung.  Shortness of breath.  EXAM: PORTABLE CHEST - 1 VIEW  COMPARISON:  Chest x-ray 02/15/2013.  FINDINGS: Diffuse interstitial prominence and peribronchial cuffing, similar prior studies. No acute consolidative airspace disease. No pleural effusions. No evidence of pulmonary edema. Heart size is normal. Upper mediastinal contours are within normal limits. Atherosclerosis in the thoracic aorta. Multiple old healed left-sided rib fractures are incidentally noted.  IMPRESSION: 1. Diffuse interstitial prominence and peribronchial cuffing, similar to prior studies, favored to reflect chronic bronchitis. 2. No definite acute findings are noted. 3. Atherosclerosis.   Electronically Signed   By: Trudie Reedaniel  Entrikin M.D.   On: 02/28/2014 10:55    Jeoffrey MassedGHIMIRE,SHANKER, MD  [facility] Pager:[phone]  If 7PM-7AM, please contact night-coverage [email or web address] Password [email or web address] 03/01/2014, 8:38 AM   LOS: 1 day   **Disclaimer: This note may have been dictated with voice recognition software. Similar sounding words can inadvertently be transcribed and this note may contain transcription errors which may not have been corrected upon publication of note.**

## 2014-03-01 NOTE — Progress Notes (Signed)
Clinical Social Work Department CLINICAL SOCIAL WORK PLACEMENT NOTE 03/01/2014  Patient:  Caffie PintoMARLOWE,Valari W  Account Number:  [id number] Admit date:  02/28/2014  Clinical Social Worker:  Jetta LoutBAILEY MORGAN, Theresia MajorsLCSWA  Date/time:  03/01/2014 07:05 PM  Clinical Social Work is seeking post-discharge placement for this patient at the following level of care:   SKILLED NURSING   (*CSW will update this form in Epic as items are completed)   03/01/2014  Patient/family provided with [facility] of Clinical [facility] Work's list of facilities offering this level of care within the geographic area requested by the patient (or if unable, by the patient's family).  03/01/2014  Patient/family informed of their freedom to choose among providers that offer the needed level of care, that participate in Medicare, Medicaid or managed care program needed by the patient, have an available bed and are willing to accept the patient.  03/01/2014  Patient/family informed of [facility]' ownership interest in [facility], as well as of the fact that they are under no obligation to receive care at this facility.  PASARR submitted to EDS on  PASARR number received on   FL2 transmitted to all facilities in geographic area requested by pt/family on  03/01/2014 FL2 transmitted to all facilities within larger geographic area on   Patient informed that his/her managed care company has contracts with or will negotiate with  certain facilities, including the following:     Patient/family informed of bed offers received:   Patient chooses bed at  Physician recommends and patient chooses bed at    Patient to be transferred to  on   Patient to be transferred to facility by  Patient and family notified of transfer on  Name of family member notified:    The following physician request were entered in Epic:   Additional Comments: PASARR not needed because patient is going to [place].

## 2014-03-01 NOTE — Evaluation (Signed)
Physical Therapy Evaluation Patient Details Name: Nicole PintoMary W Deer MRN: [id number] DOB: 07-31-1922 Today's Date: 03/01/2014   History of Present Illness  80 y.o. female s/p T12 fracture. Hx of COPD and HTN.  Clinical Impression  Pt admitted with T12 fracture. Pt currently with functional limitations due to the deficits listed below (see PT Problem List). Educated extensively on back precautions and safe mobility. Able to roll with assistance to don TLSO. Also able to ambulate up to 12 feet this PM with min guard for safety while using rolling walker. Pt will benefit from skilled PT to increase their independence and safety with mobility to allow discharge to the venue listed below.      Follow Up Recommendations SNF;Supervision/Assistance - 24 hour    Equipment Recommendations  3in1 (PT)    Recommendations for Other Services       Precautions / Restrictions Precautions Precautions: Back Precaution Booklet Issued: Yes (comment) Precaution Comments: Reviewed back precautions - use of TLSO - Don in supine - wear if HOB >29 degrees Required Braces or Orthoses: Spinal Brace Spinal Brace: Thoracolumbosacral orthotic;Applied in supine position Restrictions Weight Bearing Restrictions: No      Mobility  Bed Mobility Overal bed mobility: Needs Assistance Bed Mobility: Rolling;Sidelying to Sit Rolling: Min assist Sidelying to sit: Min assist       General bed mobility comments: Brace donned in supine. Pt able to assist with roll. Educated on log roll with max VCs for technique. Pt able to hold sidelying position using rail while brace is adjusted. Performed roll to Lt and Rt safely maintaining back precautions. Sidelying to sit with min assist for trunk control and max VCs to bring legs off of bed. Tactile cues for positioning to maintain neutral alignment.  Transfers Overall transfer level: Needs assistance Equipment used: Rolling walker (2 wheeled) Transfers: Sit to/from Stand Sit  to Stand: Min guard         General transfer comment: Min assist for boost with VCs to maintain back precautions. VCs for hand placement. Pt needed assist for hand placement with sitting and became very anxious due to fatigue and wanted to quickly sit after short ambulatory bout.  Ambulation/Gait Ambulation/Gait assistance: Min guard Ambulation Distance (Feet): 12 Feet Assistive device: Rolling walker (2 wheeled) Gait Pattern/deviations: Step-through pattern;Decreased step length - right;Decreased step length - left;Decreased stride length;Shuffle   Gait velocity interpretation: Below normal speed for age/gender General Gait Details: Very slow and guarded gait. VCs for upright posture and forward gaze. Educated on safe DME use and walker positioning. Fatigued after 12 feet and became anxious needing to sit immediately.  Stairs            Wheelchair Mobility    Modified Rankin (Stroke Patients Only)       Balance Overall balance assessment: Needs assistance Sitting-balance support: No upper extremity supported;Feet supported Sitting balance-Leahy Scale: Fair     Standing balance support: Bilateral upper extremity supported Standing balance-Leahy Scale: Poor                               Pertinent Vitals/Pain Pt reports pain as high - no numerical value given - states nursing has just given pain medication prior to therapy session Nurse aware Patient repositioned in chair for comfort.     Home Living Family/patient expects to be discharged to:: Skilled nursing facility Living Arrangements: Alone Available Help at Discharge: Skilled Nursing Facility [facility]  Access: Level entry     Home Layout: One level;Laundry or work area in [employer]ney [employer]basement Home Equipment: Environmental consultantWalker - 2 wheels;Shower seat      Prior Function Level of Independence: Needs assistance   Gait / Transfers Assistance Needed: Rolling walker  ADL's / Homemaking Assistance  Needed: Needs assist from sister to shower  Comments: occasional use of rolling walker for ambulation     Hand Dominance   Dominant Hand: Right    Extremity/Trunk Assessment   Upper Extremity Assessment: Overall WFL for tasks assessed           Lower Extremity Assessment: Generalized weakness (Normal sensation bilaterally.)         Communication   Communication: No difficulties  Cognition Arousal/Alertness: Awake/alert Behavior During Therapy: WFL for tasks assessed/performed Overall Cognitive Status: Within Functional Limits for tasks assessed                      General Comments      Exercises        Assessment/Plan    PT Assessment Patient needs continued PT services  PT Diagnosis Difficulty walking;Abnormality of gait;Acute pain;Generalized weakness   PT Problem List Decreased strength;Decreased activity tolerance;Decreased range of motion;Decreased mobility;Decreased balance;Decreased knowledge of use of DME;Decreased knowledge of precautions;Cardiopulmonary status limiting activity;Pain  PT Treatment Interventions DME instruction;Gait training;Functional mobility training;Therapeutic activities;Therapeutic exercise;Balance training;Neuromuscular re-education;Patient/family education;Modalities   PT Goals (Current goals can be found in the Care Plan section) Acute Rehab PT Goals Patient Stated Goal: Get better PT Goal Formulation: With patient Time For Goal Achievement: 03/08/14 Potential to Achieve Goals: Fair    Frequency Min 3X/week   Barriers to discharge Decreased caregiver support Patient lives alone    Co-evaluation               End of Session Equipment Utilized During Treatment: Back brace Activity Tolerance: Patient tolerated treatment well Patient left: in chair;with call bell/phone within reach Nurse Communication: Mobility status         Time: [id number] PT Time Calculation (min): 48 min   Charges:   PT  Evaluation $Initial PT Evaluation Tier I: 1 Procedure PT Treatments $Gait Training: 8-22 mins $Therapeutic Activity: 8-22 mins $Self Care/Home Management: 8-22   PT G Codes:         Charlsie MerlesLogan Secor Barbour, PT [phone]  Berton MountBarbour, Logan S 03/01/2014, 4:17 PM

## 2014-03-01 NOTE — Progress Notes (Signed)
Patient ID: Nicole Stein, female   DOB: [birth year], 80 y.o.   MRN: [id number] Subjective:  The patient is alert and pleasant. She continues to have back pain. She has been fitted for a TLSO.  Objective: Vital signs in last 24 hours: Temp:  [97.8 F (36.6 C)-99.3 F (37.4 C)] 98.7 F (37.1 C) (06/28 0452) Pulse Rate:  [72-97] 83 (06/28 0452) Resp:  [12-18] 12 (06/28 0452) BP: (138-173)/(63-81) 152/67 mmHg (06/28 0452) SpO2:  [93 %-97 %] 95 % (06/28 0452)  Intake/Output from previous day: 06/27 0701 - 06/28 0700 In: -  Out: 300 [Urine:300] Intake/Output this shift:    Physical exam the patient is alert and oriented. Her strength is grossly normal in her bile gastrocnemius and dorsiflexors.  Lab Results:  Recent Labs  02/28/14 0724  WBC 7.5  HGB 11.2*  HCT 33.4*  PLT 306   BMET  Recent Labs  02/28/14 0724  NA 139  K 4.1  CL 101  CO2 23  GLUCOSE 86  BUN 19  CREATININE 0.79  CALCIUM 9.1    Studies/Results: Dg Chest Port 1 View  02/28/2014   CLINICAL DATA:  Crackles on the left lung.  Shortness of breath.  EXAM: PORTABLE CHEST - 1 VIEW  COMPARISON:  Chest x-ray 02/15/2013.  FINDINGS: Diffuse interstitial prominence and peribronchial cuffing, similar prior studies. No acute consolidative airspace disease. No pleural effusions. No evidence of pulmonary edema. Heart size is normal. Upper mediastinal contours are within normal limits. Atherosclerosis in the thoracic aorta. Multiple old healed left-sided rib fractures are incidentally noted.  IMPRESSION: 1. Diffuse interstitial prominence and peribronchial cuffing, similar to prior studies, favored to reflect chronic bronchitis. 2. No definite acute findings are noted. 3. Atherosclerosis.   Electronically Signed   By: Trudie Reedaniel  Entrikin M.D.   On: 02/28/2014 10:55    Assessment/Plan: T12 fracture: We will mobilize the patient in a TLSO with PT. She needs to done the TLSO while supine.  LOS: 1 day     JENKINS,JEFFREY  D 03/01/2014, 9:31 AM

## 2014-03-02 DIAGNOSIS — J984 Other disorders of lung: Secondary | ICD-10-CM

## 2014-03-02 LAB — URINALYSIS, ROUTINE W REFLEX MICROSCOPIC
BILIRUBIN URINE: NEGATIVE
GLUCOSE, UA: NEGATIVE mg/dL
KETONES UR: NEGATIVE mg/dL
Nitrite: POSITIVE — AB
PH: 5.5 (ref 5.0–8.0)
PROTEIN: NEGATIVE mg/dL
Specific Gravity, Urine: 1.019 (ref 1.005–1.030)
Urobilinogen, UA: 1 mg/dL (ref 0.0–1.0)

## 2014-03-02 LAB — URINE MICROSCOPIC-ADD ON

## 2014-03-02 MED ORDER — OXYCODONE HCL ER 20 MG PO T12A: 40.0000 mg | EXTENDED_RELEASE_TABLET | Freq: Two times a day (BID) | ORAL | Status: AC

## 2014-03-02 MED ORDER — FLEET ENEMA 7-19 GM/118ML RE ENEM: 1.0000 | ENEMA | Freq: Every day | RECTAL | Status: AC | PRN

## 2014-03-02 MED ORDER — SENNA 8.6 MG PO TABS: 2.0000 | ORAL_TABLET | Freq: Every day | ORAL | Status: AC

## 2014-03-02 MED ORDER — FLEET ENEMA 7-19 GM/118ML RE ENEM
1.0000 | ENEMA | Freq: Once | RECTAL | Status: AC
  Administered 2014-03-02: 1 via RECTAL
  Filled 2014-03-02: qty 1

## 2014-03-02 MED ORDER — OXYCODONE HCL ER 40 MG PO T12A
40.0000 mg | EXTENDED_RELEASE_TABLET | Freq: Two times a day (BID) | ORAL | Status: DC
  Administered 2014-03-02 – 2014-03-03 (×3): 40 mg via ORAL
  Filled 2014-03-02 (×3): qty 1

## 2014-03-02 MED ORDER — LISINOPRIL 20 MG PO TABS: 40.0000 mg | ORAL_TABLET | Freq: Every day | ORAL | Status: AC

## 2014-03-02 MED ORDER — OXYCODONE HCL 5 MG PO TABS: 5.0000 mg | ORAL_TABLET | ORAL | Status: AC | PRN

## 2014-03-02 MED ORDER — POLYETHYLENE GLYCOL 3350 17 G PO PACK
17.0000 g | PACK | Freq: Two times a day (BID) | ORAL | Status: DC
  Administered 2014-03-02 – 2014-03-03 (×3): 17 g via ORAL
  Filled 2014-03-02 (×3): qty 1

## 2014-03-02 MED ORDER — POLYETHYLENE GLYCOL 3350 17 G PO PACK: 17.0000 g | PACK | Freq: Two times a day (BID) | ORAL | Status: AC

## 2014-03-02 MED ORDER — BUSPIRONE HCL 5 MG PO TABS: 5.0000 mg | ORAL_TABLET | Freq: Two times a day (BID) | ORAL | Status: AC

## 2014-03-02 MED ORDER — LORAZEPAM 0.5 MG PO TABS: 0.5000 mg | ORAL_TABLET | Freq: Every evening | ORAL | Status: AC | PRN

## 2014-03-02 NOTE — Progress Notes (Signed)
Fleets enema administered, very small BM resulted.

## 2014-03-02 NOTE — Discharge Summary (Addendum)
PATIENT DETAILS Name: Nicole Stein Age: 80 y.o. Sex: female Date of Birth: 02-29-1922 MRN: [id number]. Admit Date: 02/28/2014 Discharge Date:03/03/14 Admitting Physician: Lynden Oxford, MD PCP:No PCP Per Patient  Recommendations for Outpatient Follow-up:  1. Optimize pain control 2. CBC/BMET in 1 week 3. Follow up with Dr Jenkins-Neurosurgery in 2 weeks  PRIMARY DISCHARGE DIAGNOSIS:  Principal Problem:   Vertebral compression fracture-T12 Active Problems:   Hypothyroidism   Falls   Essential hypertension, benign   COPD (chronic obstructive pulmonary disease)   Chronic lung disease   Back pain      PAST MEDICAL HISTORY: Past Medical History  Diagnosis Date  . Hypothyroidism   . Chronic lung disease   . COPD (chronic obstructive pulmonary disease)   . Falls   . Hypertension     DISCHARGE MEDICATIONS:   Medication List    STOP taking these medications       HYDROcodone-acetaminophen 5-325 MG per tablet  Commonly known as:  NORCO/VICODIN      TAKE these medications       albuterol (2.5 MG/3ML) 0.083% nebulizer solution  Commonly known as:  PROVENTIL  Take 2.5 mg by nebulization every 4 (four) hours as needed for wheezing or shortness of breath.     aspirin EC 81 MG tablet  Take 81 mg by mouth daily.     busPIRone 5 MG tablet  Commonly known as:  BUSPAR  Take 1 tablet (5 mg total) by mouth 2 (two) times daily.     cholecalciferol 1000 UNITS tablet  Commonly known as:  VITAMIN D  Take 1,000 Units by mouth daily.     cromolyn 20 MG/2ML nebulizer solution  Commonly known as:  INTAL  Take 20 mg by nebulization 2 (two) times daily.     levothyroxine 50 MCG tablet  Commonly known as:  SYNTHROID, LEVOTHROID  Take 50 mcg by mouth daily before breakfast.     lisinopril 20 MG tablet  Commonly known as:  PRINIVIL,ZESTRIL  Take 2 tablets (40 mg total) by mouth daily.     LORazepam 0.5 MG tablet  Commonly known as:  ATIVAN  Take 1 tablet (0.5 mg total) by  mouth at bedtime as needed for anxiety or sleep.     oxyCODONE 5 MG immediate release tablet  Commonly known as:  Oxy IR/ROXICODONE  Take 1-2 tablets (5-10 mg total) by mouth every 4 (four) hours as needed for moderate pain.     OxyCODONE 20 mg T12a 12 hr tablet  Commonly known as:  OXYCONTIN  Take 2 tablets (40 mg total) by mouth every 12 (twelve) hours.     polyethylene glycol packet  Commonly known as:  MIRALAX / GLYCOLAX  Take 17 g by mouth 2 (two) times daily.     senna 8.6 MG Tabs tablet  Commonly known as:  SENOKOT  Take 2 tablets (17.2 mg total) by mouth at bedtime.     sodium phosphate 7-19 GM/118ML Enem  Place 133 mLs (1 enema total) rectally daily as needed for severe constipation.        ALLERGIES:  No Known Allergies  BRIEF HPI:  See H&P, Labs, Consult and Test reports for all details in brief, patient is a 80 year old female with a history of hypothyroidism, COPD, hypertension and recurrent falls who presented as a transfer from [facility] after episode of fall and worsening back pain. MRI of the spine showed a T12 compression fracture with retropulsion of fracture fragments into the spinal canal  causing spinal stenosis.  CONSULTATIONS:   Neurosurgery  PERTINENT RADIOLOGIC STUDIES: Dg Chest Port 1 View  02/28/2014   CLINICAL DATA:  Crackles on the left lung.  Shortness of breath.  EXAM: PORTABLE CHEST - 1 VIEW  COMPARISON:  Chest x-ray 02/15/2013.  FINDINGS: Diffuse interstitial prominence and peribronchial cuffing, similar prior studies. No acute consolidative airspace disease. No pleural effusions. No evidence of pulmonary edema. Heart size is normal. Upper mediastinal contours are within normal limits. Atherosclerosis in the thoracic aorta. Multiple old healed left-sided rib fractures are incidentally noted.  IMPRESSION: 1. Diffuse interstitial prominence and peribronchial cuffing, similar to prior studies, favored to reflect chronic bronchitis. 2. No  definite acute findings are noted. 3. Atherosclerosis.   Electronically Signed   By: Trudie Reedaniel  Entrikin M.D.   On: 02/28/2014 10:55     PERTINENT LAB RESULTS: CBC:  Recent Labs  02/28/14 0724  WBC 7.5  HGB 11.2*  HCT 33.4*  PLT 306   CMET CMP     Component Value Date/Time   NA 139 02/28/2014 0724   K 4.1 02/28/2014 0724   CL 101 02/28/2014 0724   CO2 23 02/28/2014 0724   GLUCOSE 86 02/28/2014 0724   BUN 19 02/28/2014 0724   CREATININE 0.79 02/28/2014 0724   CALCIUM 9.1 02/28/2014 0724   PROT 7.1 02/28/2014 0724   ALBUMIN 3.1* 02/28/2014 0724   AST 33 02/28/2014 0724   ALT 23 02/28/2014 0724   ALKPHOS 113 02/28/2014 0724   BILITOT 0.5 02/28/2014 0724   GFRNONAA 71* 02/28/2014 0724   GFRAA 82* 02/28/2014 0724    GFR Estimated Creatinine Clearance: 40.4 ml/min (by C-G formula based on Cr of 0.79). No results found for this basename: LIPASE, AMYLASE,  in the last 72 hours No results found for this basename: CKTOTAL, CKMB, CKMBINDEX, TROPONINI,  in the last 72 hours No components found with this basename: POCBNP,  No results found for this basename: DDIMER,  in the last 72 hours No results found for this basename: HGBA1C,  in the last 72 hours No results found for this basename: CHOL, HDL, LDLCALC, TRIG, CHOLHDL, LDLDIRECT,  in the last 72 hours No results found for this basename: TSH, T4TOTAL, FREET3, T3FREE, THYROIDAB,  in the last 72 hours No results found for this basename: VITAMINB12, FOLATE, FERRITIN, TIBC, IRON, RETICCTPCT,  in the last 72 hours Coags:  Recent Labs  02/28/14 0724  INR 1.02   Microbiology: No results found for this or any previous visit (from the past 240 hour(s)).   BRIEF HOSPITAL COURSE:   Principal Problem:  Vertebral compression fracture-T12  - Secondary to mechanical fall, unfortunately patient sustained a T12 compression fracture with retropulsion of fracture fragments into the spinal canal causing spinal stenosis. Seen by neurosurgery, options  including surgery and conservative teams discussed with family, current recommendations are to avoid surgery if at all possible.  -Placed on TLSO brace, and mobilizedith physical therapy, current recommendations are to transfer to SNF for further rehab.Patient is agreeable. Started on long-acting OxyContin, will increase to 40 mg twice a day, continue using oxycodone for moderate breakthrough pain.Continue Senokot and Miralax for bowel regimen. Patient is relatively better controlled, will need further optimization of her narcotic regimen as an outpatient. Follow clinical course. Will need follow up with Dr. Lovell SheehanJenkins from the neurosurgical service in a few weeks.  Active Problems:  COPD  - Lungs clear, continue with as needed albuterol nebulizer.   Hypothyroidism  - Continue levothyroxine   Hypertension  -  Better controlled, continue with lisinopril-but increase to 40 mg  TODAY-DAY OF DISCHARGE:  Subjective:   Nicole Stein today has no headache,no chest abdominal pain,no new weakness tingling or numbness, feels much better. She is stable to be transferred to skilled nursing facility to   Objective:   Blood pressure 151/85, pulse 80, temperature 97.8 F (36.6 C), temperature source Oral, resp. rate 16, height 5\' 4"  (1.626 m), weight 60.601 kg (133 lb 9.6 oz), SpO2 93.00%.  Intake/Output Summary (Last 24 hours) at 03/02/14 0836 Last data filed at 03/02/14 0300  Gross per 24 hour  Intake      0 ml  Output    350 ml  Net   -350 ml   Filed Weights   02/28/14 0030  Weight: 60.601 kg (133 lb 9.6 oz)    Exam Awake Alert, Oriented *3, No new F.N deficits, Normal affect [place].AT,PERRAL Supple Neck,No JVD, No cervical lymphadenopathy appriciated.  Symmetrical Chest wall movement, Good air movement bilaterally, CTAB RRR,No Gallops,Rubs or new Murmurs, No Parasternal Heave +ve B.Sounds, Abd Soft, Non tender, No organomegaly appriciated, No rebound -guarding or rigidity. No Cyanosis, Clubbing  or edema, No new Rash or bruise  DISCHARGE CONDITION: Stable  DISPOSITION: SNF  DISCHARGE INSTRUCTIONS:    Activity:  As tolerated with Full fall precautions use walker/cane & assistance as needed  Diet recommendation: Heart Healthy diet      Discharge Instructions   Call MD for:  severe uncontrolled pain    Complete by:  As directed      Diet - low sodium heart healthy    Complete by:  As directed      Increase activity slowly    Complete by:  As directed            Follow-up Information   Follow up with Cristi LoronJENKINS,JEFFREY D, MD. Schedule an appointment as soon as possible for a visit in 2 weeks.   Specialty:  Neurosurgery   Contact information:   [address] [place] [id number] [phone]      Total Time spent on discharge equals 45 minutes.  SignedJeoffrey Massed: GHIMIRE,SHANKER 03/02/2014 8:36 AM  **Disclaimer: This note may have been dictated with voice recognition software. Similar sounding words can inadvertently be transcribed and this note may contain transcription errors which may not have been corrected upon publication of note.**

## 2014-03-02 NOTE — Progress Notes (Addendum)
   [facility] [phone]  Ext [phone]) and Pt was accepted into facility.   Pt can d/c to facility in the a.m. If medically ready for d/c.   CSW will continue to follow Pt for d/c planning.    Nicole Croakassandra Allen [facility] CenterCSWA  [facility]  4N 1-16;  [phone] Phone: [phone]

## 2014-03-02 NOTE — Progress Notes (Signed)
Patient ID: Nicole Stein, female   DOB: 18-Jun-1922, 80 y.o.   MRN: [id number] Subjective:  The patient is alert and pleasant. She looks and feels a bit better.  Objective: Vital signs in last 24 hours: Temp:  [97.8 F (36.6 C)-98.6 F (37 C)] 98.3 F (36.8 C) (06/29 1440) Pulse Rate:  [80-105] 83 (06/29 1440) Resp:  [16-20] 20 (06/29 1440) BP: (137-182)/(66-85) 142/66 mmHg (06/29 1440) SpO2:  [92 %-96 %] 95 % (06/29 1440)  Intake/Output from previous day: 06/28 0701 - 06/29 0700 In: -  Out: 350 [Urine:350] Intake/Output this shift:    Physical exam the patient is alert and oriented. Her lower extremity strength is normal.  Lab Results:  Recent Labs  02/28/14 0724  WBC 7.5  HGB 11.2*  HCT 33.4*  PLT 306   BMET  Recent Labs  02/28/14 0724  NA 139  K 4.1  CL 101  CO2 23  GLUCOSE 86  BUN 19  CREATININE 0.79  CALCIUM 9.1    Studies/Results: No results found.  Assessment/Plan: T12 fracture: The patient appears to be progressing. She will likely need skilled nursing facility placement. She's is to continue to wear the TLSO.  LOS: 2 days     JENKINS,JEFFREY D 03/02/2014, 4:47 PM

## 2014-03-02 NOTE — Progress Notes (Signed)
  CSW [facility] and they will review how many Medicare days the Pt has left and contact CSW back to give CSW a bed offer.     CSW will continue to follow Pt for d/c planning.    Leron Croakassandra Allen [facility] IncCSWA  [facility]  4N 1-16;  [phone] Phone: [phone]

## 2014-03-02 NOTE — Progress Notes (Addendum)
CSW received  [facility] [place] stating that they do have beds available for the Pt and will check on Medicare beds that the Pt may have left.  CSW will relay information to the Pt's HCPOA.  CSW will continue to follow Pt for d/c planning.    Leron Croakassandra Faizon Capozzi [facility] LtdCSWA  [facility]  4N 1-16;  [phone] Phone: [phone]

## 2014-03-02 NOTE — Progress Notes (Signed)
UR complete.  Courtney Robarge RN, MSN 

## 2014-03-02 NOTE — Progress Notes (Signed)
PATIENT DETAILS Name: Nicole PintoMary W Haymond Age: 80 y.o. Sex: female Date of Birth: 1922/03/29 Admit Date: 02/28/2014 Admitting Physician Lynden OxfordPranav Patel, MD PCP:No PCP Per Patient  Subjective: Still complains of back pain-however much better, decreased pain with ambulation  Assessment/Plan: Principal Problem:   Vertebral compression fracture-T12 - Secondary to mechanical fall, unfortunately patient sustained a T12 compression fracture with retropulsion of fracture fragments into the spinal canal causing spinal stenosis. Seen by neurosurgery, options including surgery and conservative teams discussed with family, current recommendations are to avoid surgery if at all possible.  -Await TLSO brace, then mobilize with physical therapy. Increase long-acting OxyContin to 40 mg twice a day, continue using oxycodone for moderate breakthrough pain, IV morphine for severe breakthrough pain. Continue Senokot and Miralax for bowel regimen. Follow clinical course.  Active Problems: COPD - Lungs clear, continue with as needed albuterol nebulizer.    Hypothyroidism - Continue levothyroxine  Hypertension - moderately controlled, continue with lisinopril-but increase to 40 mg   Disposition: Remain inpatient-suspect will require SNF placement-suspect 6/30  DVT Prophylaxis: Prophylactic Lovenox  Code Status: Full code  Family Communication None at bedside  Procedures:  None  CONSULTS:  Neurosurgery   MEDICATIONS: Scheduled Meds: . cromolyn  20 mg Nebulization BID  . enoxaparin (LOVENOX) injection  40 mg Subcutaneous Daily  . levothyroxine  50 mcg Oral QAC breakfast  . lisinopril  40 mg Oral Daily  . OxyCODONE  40 mg Oral Q12H  . polyethylene glycol  17 g Oral BID  . senna  2 tablet Oral QHS  . sodium chloride  3 mL Intravenous Q12H   Continuous Infusions:  PRN Meds:.acetaminophen, acetaminophen, albuterol, hydrALAZINE, morphine injection, ondansetron (ZOFRAN) IV, ondansetron,  oxyCODONE, sodium phosphate  Antibiotics: Anti-infectives   None       PHYSICAL EXAM: Vital signs in last 24 hours: Filed Vitals:   03/01/14 2117 03/02/14 0131 03/02/14 0540 03/02/14 0932  BP: 182/79 149/76 151/85 137/67  Pulse: 105 89 80 86  Temp: 98 F (36.7 C) 98.6 F (37 C) 97.8 F (36.6 C) 98.2 F (36.8 C)  TempSrc: Oral Oral Oral Oral  Resp: 18 18 16 18   Height:      Weight:      SpO2: 96% 92% 93% 96%    Weight change:  Filed Weights   02/28/14 0030  Weight: 60.601 kg (133 lb 9.6 oz)   Body mass index is 22.92 kg/(m^2).   Gen Exam: Awake and alert with clear speech.   Neck: Supple, No JVD.   Chest: B/L Clear.   CVS: S1 S2 Regular, no murmurs.  Abdomen: soft, BS +, non tender, non distended.  Extremities: no edema, lower extremities warm to touch. Neurologic: Non Focal.   Skin: No Rash.   Wounds: N/A.    Intake/Output from previous day:  Intake/Output Summary (Last 24 hours) at 03/02/14 1309 Last data filed at 03/02/14 0300  Gross per 24 hour  Intake      0 ml  Output    350 ml  Net   -350 ml     LAB RESULTS: CBC  Recent Labs Lab 02/28/14 0724  WBC 7.5  HGB 11.2*  HCT 33.4*  PLT 306  MCV 97.1  MCH 32.6  MCHC 33.5  RDW 13.1  LYMPHSABS 1.7  MONOABS 0.5  EOSABS 0.1  BASOSABS 0.0    Chemistries   Recent Labs Lab 02/28/14 0724  NA 139  K 4.1  CL 101  CO2 23  GLUCOSE 86  BUN 19  CREATININE 0.79  CALCIUM 9.1    CBG: No results found for this basename: GLUCAP,  in the last 168 hours  GFR Estimated Creatinine Clearance: 40.4 ml/min (by C-G formula based on Cr of 0.79).  Coagulation profile  Recent Labs Lab 02/28/14 0724  INR 1.02    Cardiac Enzymes No results found for this basename: CK, CKMB, TROPONINI, MYOGLOBIN,  in the last 168 hours  No components found with this basename: POCBNP,  No results found for this basename: DDIMER,  in the last 72 hours No results found for this basename: HGBA1C,  in the last 72  hours No results found for this basename: CHOL, HDL, LDLCALC, TRIG, CHOLHDL, LDLDIRECT,  in the last 72 hours No results found for this basename: TSH, T4TOTAL, FREET3, T3FREE, THYROIDAB,  in the last 72 hours No results found for this basename: VITAMINB12, FOLATE, FERRITIN, TIBC, IRON, RETICCTPCT,  in the last 72 hours No results found for this basename: LIPASE, AMYLASE,  in the last 72 hours  Urine Studies No results found for this basename: UACOL, UAPR, USPG, UPH, UTP, UGL, UKET, UBIL, UHGB, UNIT, UROB, ULEU, UEPI, UWBC, URBC, UBAC, CAST, CRYS, UCOM, BILUA,  in the last 72 hours  MICROBIOLOGY: No results found for this or any previous visit (from the past 240 hour(s)).  RADIOLOGY STUDIES/RESULTS: Dg Chest Port 1 View  02/28/2014   CLINICAL DATA:  Crackles on the left lung.  Shortness of breath.  EXAM: PORTABLE CHEST - 1 VIEW  COMPARISON:  Chest x-ray 02/15/2013.  FINDINGS: Diffuse interstitial prominence and peribronchial cuffing, similar prior studies. No acute consolidative airspace disease. No pleural effusions. No evidence of pulmonary edema. Heart size is normal. Upper mediastinal contours are within normal limits. Atherosclerosis in the thoracic aorta. Multiple old healed left-sided rib fractures are incidentally noted.  IMPRESSION: 1. Diffuse interstitial prominence and peribronchial cuffing, similar to prior studies, favored to reflect chronic bronchitis. 2. No definite acute findings are noted. 3. Atherosclerosis.   Electronically Signed   By: Trudie Reedaniel  Entrikin M.D.   On: 02/28/2014 10:55    Jeoffrey MassedGHIMIRE,SHANKER, MD  [facility] Pager:[phone]  If 7PM-7AM, please contact night-coverage [email or web address] Password [email or web address] 03/02/2014, 1:09 PM   LOS: 2 days   **Disclaimer: This note may have been dictated with voice recognition software. Similar sounding words can inadvertently be transcribed and this note may contain transcription errors which may not have been corrected upon  publication of note.**

## 2014-03-03 DIAGNOSIS — E038 Other specified hypothyroidism: Secondary | ICD-10-CM

## 2014-03-03 DIAGNOSIS — E0789 Other specified disorders of thyroid: Secondary | ICD-10-CM

## 2014-03-03 NOTE — Progress Notes (Signed)
Patient ID: Nicole Stein, female   DOB: June 15, 1922, 80 y.o.   MRN: [id number] Subjective:  The patient is alert and pleasant. She looks well. She is in no apparent distress. She feels a bit better.  Objective: Vital signs in last 24 hours: Temp:  [97.8 F (36.6 C)-98.7 F (37.1 C)] 97.8 F (36.6 C) (06/30 0603) Pulse Rate:  [83-92] 92 (06/30 0603) Resp:  [16-20] 16 (06/30 0603) BP: (122-176)/(61-81) 122/78 mmHg (06/30 0603) SpO2:  [91 %-96 %] 94 % (06/30 0603)  Intake/Output from previous day:   Intake/Output this shift:    The patient is alert and oriented. Her strength is grossly normal in her lower extremities.  Lab Results: No results found for this basename: WBC, HGB, HCT, PLT,  in the last 72 hours BMET No results found for this basename: NA, K, CL, CO2, GLUCOSE, BUN, CREATININE, CALCIUM,  in the last 72 hours  Studies/Results: No results found.  Assessment/Plan: T12 fracture, thoracic pain: The patient is progressively mobilizing with her TLSO. We are working on skilled nursing facility placement. She can be transferred when these arrangements are made from my point of view. She will need to followup with me in the office in a few weeks.  LOS: 3 days     JENKINS,JEFFREY D 03/03/2014, 7:51 AM

## 2014-03-03 NOTE — Progress Notes (Signed)
[facility]. Pain medication given prior to transport. Patient alert and oriented, vioding adequate amount of urine. Patient stated understanding of instructions given and the important of wearing her TLSO brace when ambulating. RN tried calling to give report at facility but no respond. Will try calling again before the end of the day. Marin RobertsAisha Ibrahim RN

## 2014-03-03 NOTE — Care Management Note (Signed)
    Page 1 of 1   03/03/2014     10:35:27 AM CARE MANAGEMENT NOTE 03/03/2014  Patient:  Nicole Stein, Nicole Stein   Account Number:  [id number]  Date Initiated:  03/02/2014  Documentation initiated by:  Lorne Skeens  Subjective/Objective Assessment:   Patient was admitted with T12 compression fracture with retropulsion of fracture fragments into the spinal canal. Lives alone     Action/Plan:   Will follow for discharge needs pending PT/OT evals and physician orders.   Anticipated DC Date:  03/03/2014   Anticipated DC Plan:  SKILLED NURSING FACILITY  In-house referral  Clinical Social Worker         Choice offered to / List presented to:             Status of service:  Completed, signed off Medicare Important Message given?  YES (If response is "NO", the following Medicare IM given date fields will be blank) Date Medicare IM given:  03/03/2014 Medicare IM given by:  Lorne Skeens Date Additional Medicare IM given:   Additional Medicare IM given by:    Discharge Disposition:    Per UR Regulation:  Reviewed for med. necessity/level of care/duration of stay  If discussed at [place] of Stay Meetings, dates discussed:    Comments:  03/03/14 [place], MSN, CM- Met with patient to provide Medicare IM letter.

## 2014-03-03 NOTE — Clinical Social Work Note (Signed)
Per MD [facility] [place]. Patient's son Aurther Lofterry informed of DC. Ambulance transport requested.   Roddie McBryant Campbell MSW, [place] Ward[place]CSWA, [place], [phone]

## 2014-09-12 IMAGING — CR DG CHEST 1V PORT
1 series · 1 of 1 positions shown · non-contrast
Comparison: Chest x-ray 02/15/2013.

CLINICAL DATA: Crackles on the left lung.  Shortness of breath.

EXAM:
PORTABLE CHEST - 1 VIEW

[AP]
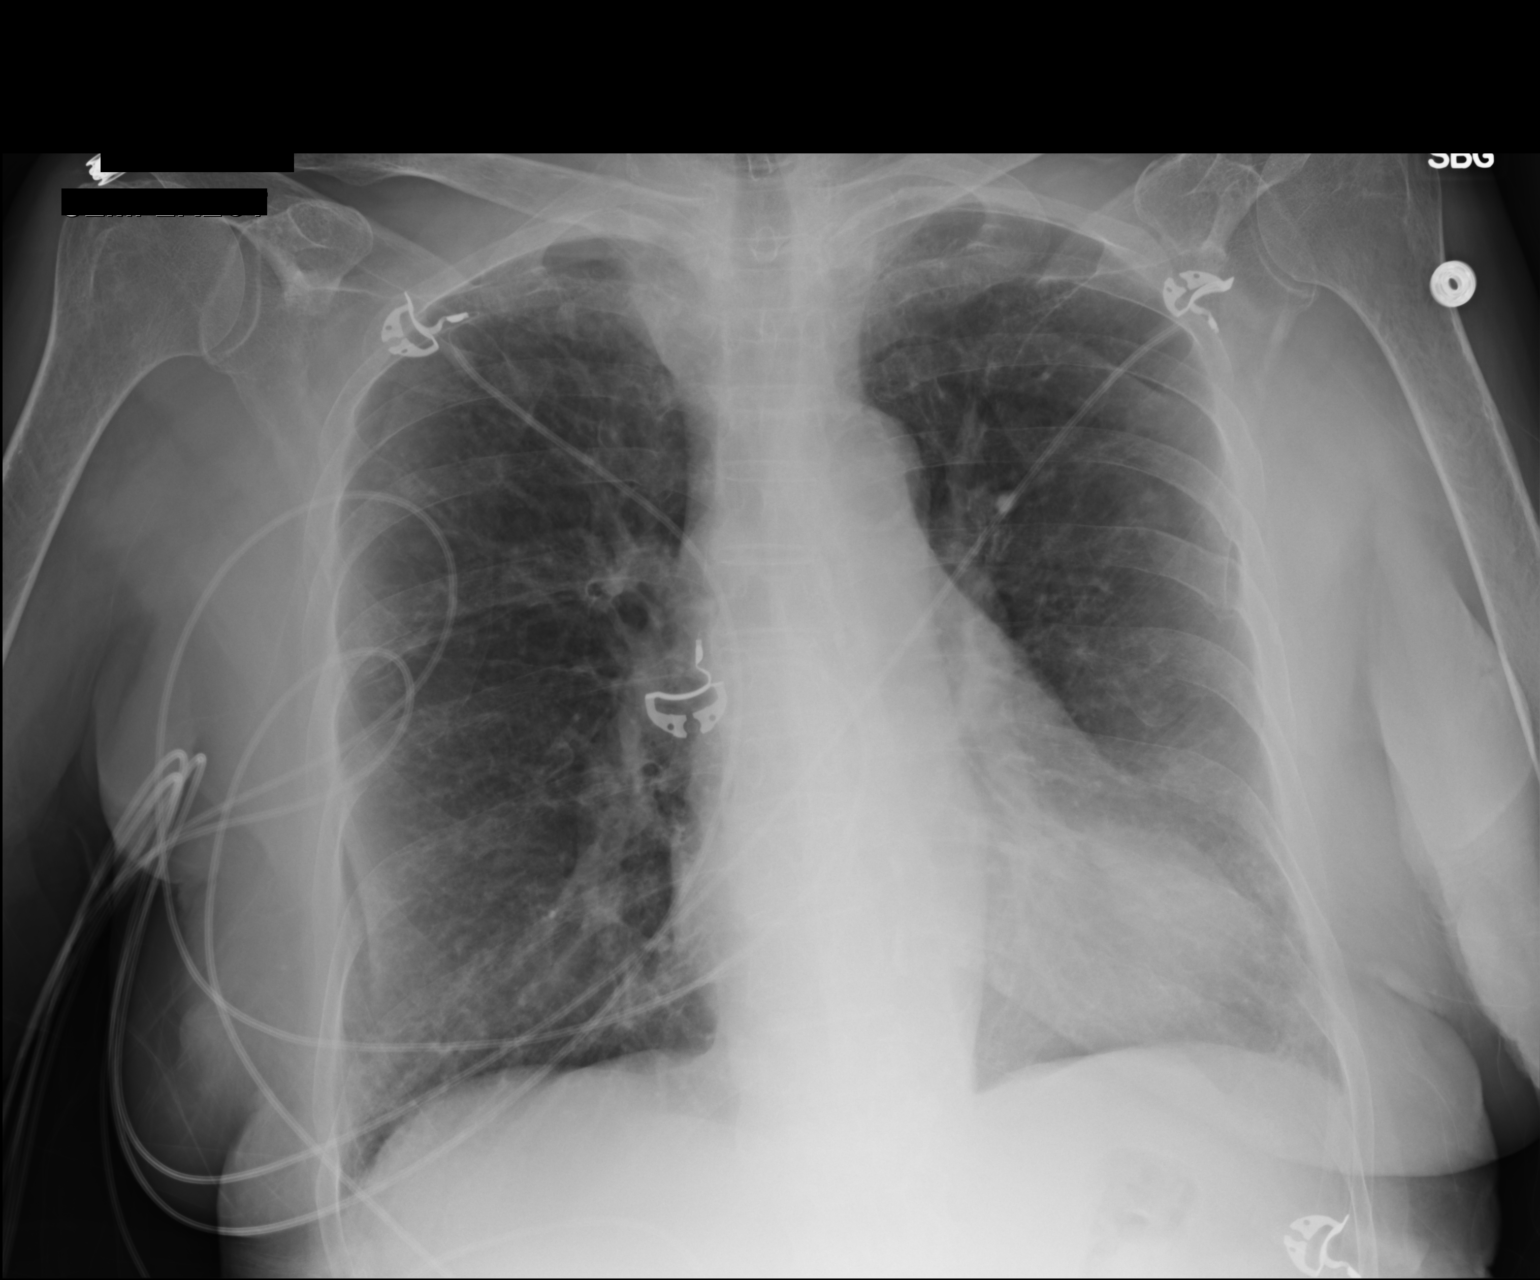

[1 of 1 positions shown; findings below may reference images not displayed]

FINDINGS: Diffuse interstitial prominence and peribronchial cuffing, similar
prior studies. No acute consolidative airspace disease. No pleural
effusions. No evidence of pulmonary edema. Heart size is normal.
Upper mediastinal contours are within normal limits. Atherosclerosis
in the thoracic aorta. Multiple old healed left-sided rib fractures
are incidentally noted.
IMPRESSION: 1. Diffuse interstitial prominence and peribronchial cuffing,
similar to prior studies, favored to reflect chronic bronchitis.
2. No definite acute findings are noted.
3. Atherosclerosis.

## 2016-08-04 DEATH — deceased
